# Patient Record
Sex: Male | Born: 1990 | Race: Black or African American | Hispanic: No | Marital: Single | Smoking: Never smoker
Health system: Southern US, Community
[De-identification: ages and names within clinical notes are randomized; demographics above are authoritative.]

## PROBLEM LIST (undated history)

## (undated) HISTORY — PX: OTHER SURGICAL HISTORY: SHX169

---
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
## 2011-05-19 LAB — POCT RAPID STREP A: Streptococcus, Group A Screen (Direct): NEGATIVE

## 2015-11-18 ENCOUNTER — Ambulatory Visit (INDEPENDENT_AMBULATORY_CARE_PROVIDER_SITE_OTHER): Payer: 59 | Admitting: Family Medicine

## 2015-11-18 VITALS — BP 122/64 | HR 87 | Temp 101.0°F | Resp 16 | Ht 73.0 in | Wt 218.0 lb

## 2015-11-18 DIAGNOSIS — R079 Chest pain, unspecified: Secondary | ICD-10-CM | POA: Diagnosis not present

## 2015-11-18 DIAGNOSIS — R112 Nausea with vomiting, unspecified: Secondary | ICD-10-CM

## 2015-11-18 DIAGNOSIS — A084 Viral intestinal infection, unspecified: Secondary | ICD-10-CM | POA: Diagnosis not present

## 2015-11-18 MED ORDER — ONDANSETRON 8 MG PO TBDP: 8.0000 mg | ORAL_TABLET | Freq: Three times a day (TID) | ORAL | Status: AC | PRN

## 2015-11-18 NOTE — Patient Instructions (Addendum)
IF you received an x-ray today, you will receive an invoice from [facility]. Please contact [facility] at [phone] with questions or concerns regarding your invoice.   IF you received labwork today, you will receive an invoice from [facility]. Please contact [facility] at [phone] with questions or concerns regarding your invoice.   Our billing staff will not be able to assist you with questions regarding bills from these companies.  You will be contacted with the lab results as soon as they are available. The fastest way to get your results is to activate your My Chart account. Instructions are located on the last page of this paperwork. If you have not heard from us regarding the results in 2 weeks, please contact this office.    Viral Gastroenteritis Viral gastroenteritis is also known as stomach flu. This condition affects the stomach and intestinal tract. It can cause sudden diarrhea and vomiting. The illness typically lasts 3 to 8 days. Most people develop an immune response that eventually gets rid of the virus. While this natural response develops, the virus can make you quite ill. CAUSES  Many different viruses can cause gastroenteritis, such as rotavirus or noroviruses. You can catch one of these viruses by consuming contaminated food or water. You may also catch a virus by sharing utensils or other personal items with an infected person or by touching a contaminated surface. SYMPTOMS  The most common symptoms are diarrhea and vomiting. These problems can cause a severe loss of body fluids (dehydration) and a body salt (electrolyte) imbalance. Other symptoms may include:  Fever.  Headache.  Fatigue.  Abdominal pain. DIAGNOSIS  Your caregiver can usually diagnose viral gastroenteritis based on your symptoms and a physical exam. A stool sample may also be taken to test for the presence of viruses or other infections. TREATMENT   This illness typically goes away on its own. Treatments are aimed at rehydration. The most serious cases of viral gastroenteritis involve vomiting so severely that you are not able to keep fluids down. In these cases, fluids must be given through an intravenous line (IV). HOME CARE INSTRUCTIONS   Drink enough fluids to keep your urine clear or pale yellow. Drink small amounts of fluids frequently and increase the amounts as tolerated.  Ask your caregiver for specific rehydration instructions.  Avoid:  Foods high in sugar.  Alcohol.  Carbonated drinks.  Tobacco.  Juice.  Caffeine drinks.  Extremely hot or cold fluids.  Fatty, greasy foods.  Too much intake of anything at one time.  Dairy products until 24 to 48 hours after diarrhea stops.  You may consume probiotics. Probiotics are active cultures of beneficial bacteria. They may lessen the amount and number of diarrheal stools in adults. Probiotics can be found in yogurt with active cultures and in supplements.  Wash your hands well to avoid spreading the virus.  Only take over-the-counter or prescription medicines for pain, discomfort, or fever as directed by your caregiver. Do not give aspirin to children. Antidiarrheal medicines are not recommended.  Ask your caregiver if you should continue to take your regular prescribed and over-the-counter medicines.  Keep all follow-up appointments as directed by your caregiver. SEEK IMMEDIATE MEDICAL CARE IF:   You are unable to keep fluids down.  You do not urinate at least once every 6 to 8 hours.  You develop shortness of breath.  You notice blood in your stool or vomit. This may look like coffee grounds.  You have  abdominal pain that increases or is concentrated in one small area (localized).  You have persistent vomiting or diarrhea.  You have a fever.  The patient is a child younger than 3 months, and he or she has a fever.  The patient is a child older than 3  months, and he or she has a fever and persistent symptoms.  The patient is a child older than 3 months, and he or she has a fever and symptoms suddenly get worse.  The patient is a baby, and he or she has no tears when crying. MAKE SURE YOU:   Understand these instructions.  Will watch your condition.  Will get help right away if you are not doing well or get worse.   This information is not intended to replace advice given to you by your health care provider. Make sure you discuss any questions you have with your health care provider.   Document Released: 08/09/2005 Document Revised: 11/01/2011 Document Reviewed: 05/26/2011 Elsevier Interactive Patient Education 2016 [employer]. Food Choices to Help Relieve Diarrhea, Adult When you have diarrhea, the foods you eat and your eating habits are very important. Choosing the right foods and drinks can help relieve diarrhea. Also, because diarrhea can last up to 7 days, you need to replace lost fluids and electrolytes (such as sodium, potassium, and chloride) in order to help prevent dehydration.  WHAT GENERAL GUIDELINES DO I NEED TO FOLLOW?  Slowly drink 1 cup (8 oz) of fluid for each episode of diarrhea. If you are getting enough fluid, your urine will be clear or pale yellow.  Eat starchy foods. Some good choices include white rice, white toast, pasta, low-fiber cereal, baked potatoes (without the skin), saltine crackers, and bagels.  Avoid large servings of any cooked vegetables.  Limit fruit to two servings per day. A serving is  cup or 1 small piece.  Choose foods with less than 2 g of fiber per serving.  Limit fats to less than 8 tsp (38 g) per day.  Avoid fried foods.  Eat foods that have probiotics in them. Probiotics can be found in certain dairy products.  Avoid foods and beverages that may increase the speed at which food moves through the stomach and intestines (gastrointestinal tract). Things to avoid  include:  High-fiber foods, such as dried fruit, raw fruits and vegetables, nuts, seeds, and whole grain foods.  Spicy foods and high-fat foods.  Foods and beverages sweetened with high-fructose corn syrup, honey, or sugar alcohols such as xylitol, sorbitol, and mannitol. WHAT FOODS ARE RECOMMENDED? Grains White rice. White, [place], or pita breads (fresh or toasted), including plain rolls, buns, or bagels. White pasta. Saltine, soda, or graham crackers. Pretzels. Low-fiber cereal. Cooked cereals made with water (such as cornmeal, farina, or cream cereals). Plain muffins. Matzo. Melba toast. Zwieback.  Vegetables Potatoes (without the skin). Strained tomato and vegetable juices. Most well-cooked and canned vegetables without seeds. Tender lettuce. Fruits Cooked or canned applesauce, apricots, cherries, fruit cocktail, grapefruit, peaches, pears, or plums. Fresh bananas, apples without skin, cherries, grapes, cantaloupe, grapefruit, peaches, oranges, or plums.  Meat and Other Protein Products Baked or boiled chicken. Eggs. Tofu. Fish. Seafood. Smooth peanut butter. Ground or well-cooked tender beef, ham, veal, lamb, pork, or poultry.  Dairy Plain yogurt, kefir, and unsweetened liquid yogurt. Lactose-free milk, buttermilk, or soy milk. Plain hard cheese. Beverages Sport drinks. Clear broths. Diluted fruit juices (except prune). Regular, caffeine-free sodas such as ginger ale. Water. Decaffeinated teas. Oral rehydration solutions.  Sugar-free beverages not sweetened with sugar alcohols. Other Bouillon, broth, or soups made from recommended foods.  The items listed above may not be a complete list of recommended foods or beverages. Contact your dietitian for more options. WHAT FOODS ARE NOT RECOMMENDED? Grains Whole grain, whole wheat, bran, or rye breads, rolls, pastas, crackers, and cereals. Wild or brown rice. Cereals that contain more than 2 g of fiber per serving. Corn tortillas or taco  shells. Cooked or dry oatmeal. Granola. Popcorn. Vegetables Raw vegetables. Cabbage, broccoli, Brussels sprouts, artichokes, baked beans, beet greens, corn, kale, legumes, peas, sweet potatoes, and yams. Potato skins. Cooked spinach and cabbage. Fruits Dried fruit, including raisins and dates. Raw fruits. Stewed or dried prunes. Fresh apples with skin, apricots, mangoes, pears, raspberries, and strawberries.  Meat and Other Protein Products Chunky peanut butter. Nuts and seeds. Beans and lentils. Tomasa Blase.  Dairy High-fat cheeses. Milk, chocolate milk, and beverages made with milk, such as milk shakes. Cream. Ice cream. Sweets and Desserts Sweet rolls, doughnuts, and sweet breads. Pancakes and waffles. Fats and Oils Butter. Cream sauces. Margarine. Salad oils. Plain salad dressings. Olives. Avocados.  Beverages Caffeinated beverages (such as coffee, tea, soda, or energy drinks). Alcoholic beverages. Fruit juices with pulp. Prune juice. Soft drinks sweetened with high-fructose corn syrup or sugar alcohols. Other Coconut. Hot sauce. Chili powder. Mayonnaise. Gravy. Cream-based or milk-based soups.  The items listed above may not be a complete list of foods and beverages to avoid. Contact your dietitian for more information. WHAT SHOULD I DO IF I BECOME DEHYDRATED? Diarrhea can sometimes lead to dehydration. Signs of dehydration include dark urine and dry mouth and skin. If you think you are dehydrated, you should rehydrate with an oral rehydration solution. These solutions can be purchased at pharmacies, retail stores, or online.  Drink -1 cup (120-240 mL) of oral rehydration solution each time you have an episode of diarrhea. If drinking this amount makes your diarrhea worse, try drinking smaller amounts more often. For example, drink 1-3 tsp (5-15 mL) every 5-10 minutes.  A general rule for staying hydrated is to drink 1-2 L of fluid per day. Talk to your health care provider about the specific  amount you should be drinking each day. Drink enough fluids to keep your urine clear or pale yellow.   This information is not intended to replace advice given to you by your health care provider. Make sure you discuss any questions you have with your health care provider.   Document Released: 10/30/2003 Document Revised: 08/30/2014 Document Reviewed: 07/02/2013 Elsevier Interactive Patient Education [employer]

## 2015-11-18 NOTE — Progress Notes (Signed)
[facility] [address], [place] [id number] [phone]  Date:  11/18/2015   Name:  Curtis Beard   DOB:  Feb 09, 1991   MRN:  [id number]  PCP:  No PCP Per Patient    History of Present Illness:  Curtis Beard is a 25 y.o. male patient who [facility] chief complaint of nausea, vomiting, and diarrhea.    Yesterday, with bodyaches and headache.  He then developed non-bloody or watery emesis and diarrhea.  Emesis of four times.  He had diarrhea four times.  He has some dizziness.  He has chest pains of the left side.  Since yesterday with acute onset.  He had waffles, gatorade, and chips.  Roommate had stomach virus past weekend.  He also complains of left sided cp.  This is not associated with sob, diaphoresis, dizziness, palpitations, or exertion. No family hx of sudden death <50  There are no active problems to display for this patient.   History reviewed. No pertinent past medical history.  Past Surgical History  Procedure Laterality Date  . Migrains      Social History  Substance Use Topics  . Smoking status: Never Smoker   . Smokeless tobacco: None  . Alcohol Use: None    History reviewed. No pertinent family history.  Not on File  Medication list has been reviewed and updated.  No current outpatient prescriptions on file prior to visit.   No current facility-administered medications on file prior to visit.    ROS ROS otherwise unremarkable unless listed above.  Physical Examination: BP 122/64 mmHg  Pulse 87  Temp(Src) 101 F (38.3 C) (Oral)  Resp 16  Ht 6\' 1"  (1.854 m)  Wt 218 lb (98.884 kg)  BMI 28.77 kg/m2  SpO2 98% Ideal Body Weight: Weight in (lb) to have BMI = 25: 189.1  Physical Exam  Constitutional: He is oriented to person, place, and time. He appears well-developed and well-nourished. No distress.  HENT:  Head: Normocephalic and atraumatic.  Eyes: Conjunctivae and EOM are normal. Pupils are  equal, round, and reactive to light.  Cardiovascular: Normal rate and regular rhythm.  Exam reveals no gallop and no friction rub.   No murmur heard. Pulses:      Radial pulses are 2+ on the right side, and 2+ on the left side.  Pulmonary/Chest: Effort normal. No respiratory distress.  Abdominal: Soft. Normal appearance and bowel sounds are normal. There is no hepatosplenomegaly. There is tenderness in the periumbilical area. There is no tenderness at McBurney's point and negative Murphy's sign.  Neurological: He is alert and oriented to person, place, and time.  Skin: Skin is warm and dry. He is not diaphoretic.  Psychiatric: He has a normal mood and affect. His behavior is normal.   EKG: normal as intepreted by dr. Milus Glazierlauenstein.  Assessment and Plan: Curtis Beard is a 25 y.o. male who is here today for nausea, emesis, and diarrhea. This appears to be viral. Patient given Zofran. Advised to return to clinic if symptoms do not improve in 72 hours. Advised heavy hydration, antipyretic use, and diarrhea from a diet both verbally and in handout. Cp likely due to emesis and virus.  Viral gastroenteritis  Intractable vomiting with nausea, unspecified vomiting type - Plan: ondansetron (ZOFRAN-ODT) 8 MG disintegrating tablet, EKG 12-Lead  Left sided chest pain - Plan: EKG 12-Lead     Trena PlattStephanie Shamekia Tippets, PA-C [facility] 11/18/2015 10:53 AM

## 2015-11-19 ENCOUNTER — Encounter: Payer: Self-pay | Admitting: Family Medicine

## 2017-10-21 ENCOUNTER — Ambulatory Visit: Payer: Self-pay | Admitting: Nurse Practitioner

## 2017-10-21 VITALS — BP 110/80 | HR 105 | Temp 99.1°F | Resp 20 | Wt 230.2 lb

## 2017-10-21 DIAGNOSIS — J029 Acute pharyngitis, unspecified: Secondary | ICD-10-CM

## 2017-10-21 MED ORDER — MAGIC MOUTHWASH W/LIDOCAINE: 5.0000 mL | Freq: Three times a day (TID) | ORAL | 0 refills | Status: AC | PRN

## 2017-10-21 NOTE — Patient Instructions (Addendum)

## 2017-10-21 NOTE — Progress Notes (Signed)
Subjective:     Curtis Beard is a 27 y.o. male who presents for evaluation of sore throat. Associated symptoms include headache and sore throat. Onset of symptoms was 4 days ago, and have been gradually worsening since that time. He is drinking moderate amounts of fluids. He has not had a recent close exposure to someone with proven streptococcal pharyngitis. Patient denies fever, cough, ear pain, sneezing.  Patient has taken Ibuprofen and Cepacol throat lozenges for pain with some relief.  Pain rates 10/10 according to patient.    The following portions of the patient's history were reviewed and updated as appropriate: allergies, current medications and past medical history.  Review of Systems Constitutional: positive for anorexia and fatigue, negative for chills, malaise and sweats Eyes: negative Ears, nose, mouth, throat, and face: positive for sore throat, negative for ear drainage, earaches, hoarseness and tinnitus Respiratory: negative Cardiovascular: negative Gastrointestinal: positive for nausea, negative for abdominal pain, constipation, diarrhea and vomiting Neurological: positive for headaches, negative for coordination problems, dizziness, paresthesia, tremors and weakness    Objective:    BP 110/80 (BP Location: Right Arm, Patient Position: Sitting, Cuff Size: Large)   Pulse (!) 105   Temp 99.1 F (37.3 C) (Oral)   Resp 20   Wt 230 lb 3.2 oz (104.4 kg)   SpO2 96%   BMI 30.37 kg/m  General appearance: alert, cooperative and no distress Head: Normocephalic, without obvious abnormality, atraumatic Eyes: conjunctivae/corneas clear. PERRL, EOM's intact. Fundi benign. Ears: normal TM's and external ear canals both ears Nose: no discharge, mild congestion, no sinus tenderness Throat: abnormal findings: moderate oropharyngeal erythema and no exudates Lungs: clear to auscultation bilaterally Heart: regular rate and rhythm, S1, S2 normal, no murmur, click, rub or  gallop Abdomen: soft, non-tender; bowel sounds normal; no masses,  no organomegaly Pulses: 2+ and symmetric Skin: Skin color, texture, turgor normal. No rashes or lesions Lymph nodes: cervical lymphadenopathy Neurologic: Grossly normal  Laboratory Strep test done. Results:negative.    Assessment:    Acute pharyngitis, likely  Viral pharyngitis.    Plan:    Use of OTC analgesics recommended as well as salt water gargles. Patient advised that he will be infectious for 24 hours after starting antibiotics. Follow up as needed. Ibuprofen 800mg  three times daily for 3 days.  Magic Mouthwash as prescribed.  Follow up in 3 days if symptoms do not improve.  In the interim, if fever, drooling, difficulty swallowing, go to ER. Patient and mother verbalize understanding.

## 2017-10-21 NOTE — Addendum Note (Signed)
Addended by: Denton BrickHOLLOWAY, Carita Sollars F on: 10/21/2017 02:54 PM   Modules accepted: Orders

## 2020-08-18 ENCOUNTER — Other Ambulatory Visit: Payer: Self-pay
